# Patient Record
Sex: Male | Born: 2000 | Race: Black or African American | Hispanic: No | Marital: Single | Smoking: Current every day smoker
Health system: Southern US, Community
[De-identification: ages and names within clinical notes are randomized; demographics above are authoritative.]

---
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
## 2019-06-24 ENCOUNTER — Ambulatory Visit (HOSPITAL_COMMUNITY)
Admission: EM | Admit: 2019-06-24 | Discharge: 2019-06-24 | Disposition: A | Discharge status: Home / Self Care | Payer: Self-pay | Attending: Family Medicine | Admitting: Family Medicine

## 2019-06-24 ENCOUNTER — Other Ambulatory Visit: Payer: Self-pay

## 2019-06-24 ENCOUNTER — Encounter (HOSPITAL_COMMUNITY): Payer: Self-pay

## 2019-06-24 DIAGNOSIS — Z20822 Contact with and (suspected) exposure to covid-19: Secondary | ICD-10-CM | POA: Insufficient documentation

## 2019-06-24 DIAGNOSIS — H9209 Otalgia, unspecified ear: Secondary | ICD-10-CM | POA: Insufficient documentation

## 2019-06-24 DIAGNOSIS — J36 Peritonsillar abscess: Secondary | ICD-10-CM

## 2019-06-24 LAB — POC SARS CORONAVIRUS 2 AG: SARS Coronavirus 2 Ag: NEGATIVE

## 2019-06-24 LAB — POC SARS CORONAVIRUS 2 AG -  ED
SARS Coronavirus 2 Ag: NEGATIVE
SARS Coronavirus 2 Ag: NEGATIVE

## 2019-06-24 LAB — POCT INFECTIOUS MONO SCREEN: Mono Screen: NEGATIVE

## 2019-06-24 LAB — POCT RAPID STREP A: Streptococcus, Group A Screen (Direct): NEGATIVE

## 2019-06-24 MED ORDER — CEFTRIAXONE SODIUM 1 G IJ SOLR
1.0000 g | Freq: Once | INTRAMUSCULAR | Status: AC
  Administered 2019-06-24: 1 g via INTRAMUSCULAR

## 2019-06-24 MED ORDER — CEFTRIAXONE SODIUM 1 G IJ SOLR
INTRAMUSCULAR | Status: AC
  Filled 2019-06-24: qty 10

## 2019-06-24 MED ORDER — DEXAMETHASONE SODIUM PHOSPHATE 10 MG/ML IJ SOLN
10.0000 mg | Freq: Once | INTRAMUSCULAR | Status: AC
  Administered 2019-06-24: 11:00:00 10 mg via INTRAMUSCULAR

## 2019-06-24 MED ORDER — PREDNISONE 50 MG PO TABS: 50.0000 mg | ORAL_TABLET | Freq: Every day | ORAL | 0 refills | Status: AC

## 2019-06-24 MED ORDER — DEXAMETHASONE SODIUM PHOSPHATE 10 MG/ML IJ SOLN
INTRAMUSCULAR | Status: AC
  Filled 2019-06-24: qty 1

## 2019-06-24 MED ORDER — LIDOCAINE HCL (PF) 1 % IJ SOLN
INTRAMUSCULAR | Status: AC
  Filled 2019-06-24: qty 4

## 2019-06-24 MED ORDER — AMOXICILLIN-POT CLAVULANATE 875-125 MG PO TABS: 1.0000 | ORAL_TABLET | Freq: Two times a day (BID) | ORAL | 0 refills | Status: AC

## 2019-06-24 NOTE — ED Provider Notes (Signed)
[facility]    CSN: [id number] Arrival date & time: 06/24/19  8295      History   Chief Complaint Chief Complaint  Patient presents with  . Sore Throat  . Otalgia    HPI Donald Simon is a 19 y.o. male no significant past medical history presenting today for evaluation of sore throat.  Patient states that approximately 2 days ago he began to develop sore throat and pain more prominently on the right side.  He has had associated right-sided ear pain and neck pain as well.  He has felt feverish at times.  Denies associated rhinorrhea, congestion or cough.  Denies chest pain or shortness of breath.  Denies GI symptoms.  Denies known sick exposures or Covid exposure.  Denies difficulty breathing.  HPI  History reviewed. No pertinent past medical history.  There are no problems to display for this patient.   History reviewed. No pertinent surgical history.     Home Medications    Prior to Admission medications   Medication Sig Start Date End Date Taking? Authorizing Provider  amoxicillin-clavulanate (AUGMENTIN) 875-125 MG tablet Take 1 tablet by mouth every 12 (twelve) hours for 10 days. 06/24/19 07/04/19  Monzerat Handler C, PA-C  predniSONE (DELTASONE) 50 MG tablet Take 1 tablet (50 mg total) by mouth daily for 5 days. 06/24/19 06/29/19  Garrette Caine, Junius Creamer, PA-C    Family History History reviewed. No pertinent family history.  Social History Social History   Tobacco Use  . Smoking status: Never Smoker  . Smokeless tobacco: Never Used  Substance Use Topics  . Alcohol use: Not on file  . Drug use: Not on file     Allergies   Patient has no known allergies.   Review of Systems Review of Systems  Constitutional: Positive for fatigue and fever. Negative for activity change, appetite change and chills.  HENT: Positive for ear pain and sore throat. Negative for congestion, rhinorrhea, sinus pressure and trouble swallowing.   Eyes: Negative for discharge and  redness.  Respiratory: Negative for cough, chest tightness and shortness of breath.   Cardiovascular: Negative for chest pain.  Gastrointestinal: Negative for abdominal pain, diarrhea, nausea and vomiting.  Musculoskeletal: Negative for myalgias.  Skin: Negative for rash.  Neurological: Negative for dizziness, light-headedness and headaches.     Physical Exam Triage Vital Signs ED Triage Vitals  Enc Vitals Group     BP 06/24/19 1016 139/79     Pulse Rate 06/24/19 1016 (!) 109     Resp 06/24/19 1016 18     Temp 06/24/19 1016 100.2 F (37.9 C)     Temp Source 06/24/19 1016 Oral     SpO2 06/24/19 1016 96 %     Weight --      Height --      Head Circumference --      Peak Flow --      Pain Score 06/24/19 1014 10     Pain Loc --      Pain Edu? --      Excl. in GC? --    No data found.  Updated Vital Signs BP 139/79 (BP Location: Left Arm)   Pulse (!) 109   Temp 100.2 F (37.9 C) (Oral)   Resp 18   SpO2 96%   Visual Acuity Right Eye Distance:   Left Eye Distance:   Bilateral Distance:    Right Eye Near:   Left Eye Near:    Bilateral Near:  Physical Exam Vitals and nursing note reviewed.  Constitutional:      Appearance: He is well-developed.  HENT:     Head: Normocephalic and atraumatic.     Ears:     Comments: Bilateral ears without tenderness to palpation of external auricle, tragus and mastoid, EAC's without erythema or swelling, TM's with good bony landmarks and cone of light. Non erythematous.     Mouth/Throat:     Comments: Bilateral tonsillar areas are erythematous, right greater than left, erythema extending onto soft palate on right side, exudate present on right tonsil, uvula midline without swelling Eyes:     Conjunctiva/sclera: Conjunctivae normal.  Neck:     Comments: Tender to palpation in right tonsillar area, full active range of motion of neck Cardiovascular:     Rate and Rhythm: Normal rate and regular rhythm.     Heart sounds: No  murmur.  Pulmonary:     Effort: Pulmonary effort is normal. No respiratory distress.     Breath sounds: Normal breath sounds.     Comments: Breathing comfortably at rest, CTABL, no wheezing, rales or other adventitious sounds auscultated Abdominal:     Palpations: Abdomen is soft.     Tenderness: There is no abdominal tenderness.  Musculoskeletal:     Cervical back: Neck supple.  Skin:    General: Skin is warm and dry.  Neurological:     Mental Status: He is alert.      UC Treatments / Results  Labs (all labs ordered are listed, but only abnormal results are displayed) Labs Reviewed  CULTURE, GROUP A STREP ([facility])  NOVEL CORONAVIRUS, NAA (HOSP ORDER, SEND-OUT TO REF LAB; TAT 18-24 HRS)  POC SARS CORONAVIRUS 2 AG -  ED  POCT RAPID STREP A  POCT INFECTIOUS MONO SCREEN  POC SARS CORONAVIRUS 2 AG  POC SARS CORONAVIRUS 2 AG -  ED  POCT INFECTIOUS MONO SCREEN    EKG   Radiology No results found.  Procedures Procedures (including critical care time)  Medications Ordered in UC Medications  dexamethasone (DECADRON) injection 10 mg (has no administration in time range)  cefTRIAXone (ROCEPHIN) injection 1 g (has no administration in time range)    Initial Impression / Assessment and Plan / UC Course  I have reviewed the triage vital signs and the nursing notes.  Pertinent labs & imaging results that were available during my care of the patient were reviewed by me and considered in my medical decision making (see chart for details).     Strep and mono negative, rapid Covid negative, Covid PCR pending.  Exam suggestive of early peritonsillar abscess, no soft palate swelling, but given swelling on right compared to left will go out and treat for this.  Providing Rocephin IM and Decadron.  Continuing on Augmentin twice daily x10 days with prednisone 50 mg x 5 days.  Advised if symptoms progressing or worsening to follow-up with emergency room, may follow-up with ENT if symptoms  stable and not resolving with oral antibiotics.  May need I&D.  Discussed strict return precautions. Patient verbalized understanding and is agreeable with plan.  Final Clinical Impressions(s) / UC Diagnoses   Final diagnoses:  Peritonsillar abscess     Discharge Instructions     Strep and mono negative, Covid PCR pending I am treating you for peritonsillar abscess We gave you Rocephin and Decadron today in clinic Continue with Augmentin twice daily for the next 10 days, take with food Prednisone daily with food in the morning to  help with swelling/pain May continue hot tea with honey lemon and ginger If symptoms worsening please follow-up in the emergency room Please follow-up with ENT if symptoms persist    ED Prescriptions    Medication Sig Dispense Auth. Provider   amoxicillin-clavulanate (AUGMENTIN) 875-125 MG tablet Take 1 tablet by mouth every 12 (twelve) hours for 10 days. 20 tablet Kenyetta Fife C, PA-C   predniSONE (DELTASONE) 50 MG tablet Take 1 tablet (50 mg total) by mouth daily for 5 days. 5 tablet Bert Givans, [place] C, PA-C     PDMP not reviewed this encounter.   Janith Lima, PA-C 06/24/19 1109

## 2019-06-24 NOTE — Discharge Instructions (Signed)
Strep and mono negative, Covid PCR pending I am treating you for peritonsillar abscess We gave you Rocephin and Decadron today in clinic Continue with Augmentin twice daily for the next 10 days, take with food Prednisone daily with food in the morning to help with swelling/pain May continue hot tea with honey lemon and ginger If symptoms worsening please follow-up in the emergency room Please follow-up with ENT if symptoms persist

## 2019-06-24 NOTE — ED Triage Notes (Signed)
Pt reports having sore throat and right ear pain x 2 days. Pt states he has some white patches in his throat.

## 2019-06-26 LAB — CULTURE, GROUP A STREP (THRC)

## 2019-06-27 LAB — NOVEL CORONAVIRUS, NAA (HOSP ORDER, SEND-OUT TO REF LAB; TAT 18-24 HRS): SARS-CoV-2, NAA: NOT DETECTED

## 2021-02-27 ENCOUNTER — Ambulatory Visit
Admission: EM | Admit: 2021-02-27 | Discharge: 2021-02-27 | Discharge status: Against Medical Advice | Payer: Medicaid Other

## 2021-02-27 ENCOUNTER — Other Ambulatory Visit: Payer: Self-pay

## 2022-05-12 ENCOUNTER — Ambulatory Visit
Admission: EM | Admit: 2022-05-12 | Discharge: 2022-05-12 | Disposition: A | Discharge status: Home / Self Care | Payer: Medicaid Other | Attending: Emergency Medicine | Admitting: Emergency Medicine

## 2022-05-12 ENCOUNTER — Encounter: Payer: Self-pay | Admitting: Emergency Medicine

## 2022-05-12 DIAGNOSIS — R1084 Generalized abdominal pain: Secondary | ICD-10-CM

## 2022-05-12 DIAGNOSIS — K5900 Constipation, unspecified: Secondary | ICD-10-CM | POA: Diagnosis not present

## 2022-05-12 MED ORDER — POLYETHYLENE GLYCOL 3350 17 GM/SCOOP PO POWD: 1.0000 | Freq: Once | ORAL | 0 refills | Status: AC

## 2022-05-12 MED ORDER — DICYCLOMINE HCL 20 MG PO TABS: 20.0000 mg | ORAL_TABLET | Freq: Two times a day (BID) | ORAL | 0 refills | Status: AC

## 2022-05-12 NOTE — ED Triage Notes (Signed)
Patient c/o generalized abdominal pain x 2 days.  Patient states he was having issues w/constipation.  Denies any nausea, vomiting or diarrhea.  Patient denies any OTC meds.

## 2022-05-12 NOTE — ED Provider Notes (Signed)
[facility]    CSN: [id number] Arrival date & time: 05/12/22  1239      History   Chief Complaint Chief Complaint  Patient presents with   Abdominal Pain    HPI Donald Simon is a 22 y.o. male.   Patient presents today with lower abdominal pain x 2 days not having a normal bowel movement for approximately 1 week.  Patient has a history of this.  Denies any nausea vomiting or diarrhea.  Patient has not taken anything prior to arrival.  Patient denies any urinary symptoms    History reviewed. No pertinent past medical history.  There are no problems to display for this patient.   History reviewed. No pertinent surgical history.     Home Medications    Prior to Admission medications   Medication Sig Start Date End Date Taking? Authorizing Provider  dicyclomine (BENTYL) 20 MG tablet Take 1 tablet (20 mg total) by mouth 2 (two) times daily. 05/12/22  Yes Marney Setting, NP  polyethylene glycol powder (MIRALAX) 17 GM/SCOOP powder Take 255 g by mouth once for 1 dose. 05/12/22 05/12/22 Yes Marney Setting, NP    Family History History reviewed. No pertinent family history.  Social History Social History   Tobacco Use   Smoking status: Every Day    Types: Cigars   Smokeless tobacco: Never  Vaping Use   Vaping Use: Never used  Substance Use Topics   Alcohol use: Never   Drug use: Never     Allergies   Patient has no known allergies.   Review of Systems Review of Systems  Constitutional:  Negative for fever.  Respiratory: Negative.    Cardiovascular: Negative.   Gastrointestinal:  Positive for abdominal pain, constipation and nausea. Negative for blood in stool, diarrhea and vomiting.  Genitourinary: Negative.   Musculoskeletal: Negative.   Neurological: Negative.      Physical Exam Triage Vital Signs ED Triage Vitals  Enc Vitals Group     BP 05/12/22 1318 138/82     Pulse Rate 05/12/22 1318 95     Resp 05/12/22 1318 18     Temp  05/12/22 1318 97.8 F (36.6 C)     Temp Source 05/12/22 1318 Oral     SpO2 05/12/22 1318 92 %     Weight 05/12/22 1320 290 lb (131.5 kg)     Height 05/12/22 1320 6\' 4"  (1.93 m)     Head Circumference --      Peak Flow --      Pain Score 05/12/22 1320 5     Pain Loc --      Pain Edu? --      Excl. in [place]? --    No data found.  Updated Vital Signs BP 138/82 (BP Location: Left Arm)   Pulse 95   Temp 97.8 F (36.6 C) (Oral)   Resp 18   Ht 6\' 4"  (1.93 m)   Wt 290 lb (131.5 kg)   SpO2 92%   BMI 35.30 kg/m   Visual Acuity Right Eye Distance:   Left Eye Distance:   Bilateral Distance:    Right Eye Near:   Left Eye Near:    Bilateral Near:     Physical Exam Constitutional:      Appearance: He is well-developed.  HENT:     Mouth/Throat:     Mouth: Mucous membranes are moist.  Cardiovascular:     Rate and Rhythm: Normal rate.  Pulmonary:  Effort: Pulmonary effort is normal.     Breath sounds: Normal breath sounds.  Abdominal:     General: Abdomen is flat. Bowel sounds are normal.     Tenderness: There is abdominal tenderness in the right lower quadrant and left lower quadrant. There is no right CVA tenderness, left CVA tenderness, guarding or rebound.  Skin:    General: Skin is warm.  Neurological:     General: No focal deficit present.     Mental Status: He is alert.      UC Treatments / Results  Labs (all labs ordered are listed, but only abnormal results are displayed) Labs Reviewed - No data to display  EKG   Radiology No results found.  Procedures Procedures (including critical care time)  Medications Ordered in UC Medications - No data to display  Initial Impression / Assessment and Plan / UC Course  I have reviewed the triage vital signs and the nursing notes.  Pertinent labs & imaging results that were available during my care of the patient were reviewed by me and considered in my medical decision making (see chart for details).    If  pain persist in 24 hours you need to be seen in the emergency room for further evaluation and scanning Drink plenty of fluids and stay hydrated well Take pain medicine as needed Take the MiraLAX consistently for the next 4 days and next week if you will need to start taking every 2 to 3 days consistently.  Final Clinical Impressions(s) / UC Diagnoses   Final diagnoses:  Generalized abdominal pain  Constipation, unspecified constipation type     Discharge Instructions      If pain persist in 24 hours you need to be seen in the emergency room for further evaluation and scanning Drink plenty of fluids and stay hydrated well Take pain medicine as needed Take the MiraLAX consistently for the next 4 days and next week if you will need to start taking every 2 to 3 days consistently. Call GI for a follow-up     ED Prescriptions     Medication Sig Dispense Auth. Provider   dicyclomine (BENTYL) 20 MG tablet Take 1 tablet (20 mg total) by mouth 2 (two) times daily. 20 tablet Maple Mirza L, NP   polyethylene glycol powder (MIRALAX) 17 GM/SCOOP powder Take 255 g by mouth once for 1 dose. 255 g Coralyn Mark, NP      PDMP not reviewed this encounter.   Coralyn Mark, NP 05/12/22 1425

## 2022-05-12 NOTE — Discharge Instructions (Addendum)
If pain persist in 24 hours you need to be seen in the emergency room for further evaluation and scanning Drink plenty of fluids and stay hydrated well Take pain medicine as needed Take the MiraLAX consistently for the next 4 days and next week if you will need to start taking every 2 to 3 days consistently. Call GI for a follow-up

## 2022-10-14 ENCOUNTER — Ambulatory Visit: Payer: Medicaid Other
